# Patient Record
Sex: Female | Born: 1975 | Race: White | Hispanic: No | Marital: Married | Smoking: Never smoker
Health system: Southern US, Community
[De-identification: ages and names within clinical notes are randomized; demographics above are authoritative.]

---

## 1998-07-14 ENCOUNTER — Other Ambulatory Visit: Admission: RE | Admit: 1998-07-14 | Discharge: 1998-07-14 | Payer: Self-pay | Admitting: Obstetrics and Gynecology

## 2000-02-05 ENCOUNTER — Encounter: Payer: Self-pay | Admitting: Obstetrics and Gynecology

## 2000-02-05 ENCOUNTER — Ambulatory Visit (HOSPITAL_COMMUNITY): Admission: RE | Admit: 2000-02-05 | Discharge: 2000-02-05 | Payer: Self-pay | Admitting: Obstetrics and Gynecology

## 2000-12-07 ENCOUNTER — Inpatient Hospital Stay (HOSPITAL_COMMUNITY): Admission: AD | Admit: 2000-12-07 | Discharge: 2000-12-10 | Payer: Self-pay | Admitting: Obstetrics and Gynecology

## 2001-01-08 ENCOUNTER — Other Ambulatory Visit: Admission: RE | Admit: 2001-01-08 | Discharge: 2001-01-08 | Payer: Self-pay | Admitting: Obstetrics and Gynecology

## 2002-08-11 ENCOUNTER — Emergency Department (HOSPITAL_COMMUNITY): Admission: EM | Admit: 2002-08-11 | Discharge: 2002-08-11 | Payer: Self-pay | Admitting: Emergency Medicine

## 2003-01-04 ENCOUNTER — Other Ambulatory Visit: Admission: RE | Admit: 2003-01-04 | Discharge: 2003-01-04 | Payer: Self-pay | Admitting: Obstetrics and Gynecology

## 2004-01-24 ENCOUNTER — Other Ambulatory Visit: Admission: RE | Admit: 2004-01-24 | Discharge: 2004-01-24 | Payer: Self-pay | Admitting: Obstetrics and Gynecology

## 2004-08-22 ENCOUNTER — Inpatient Hospital Stay (HOSPITAL_COMMUNITY): Admission: AD | Admit: 2004-08-22 | Discharge: 2004-08-25 | Payer: Self-pay | Admitting: Obstetrics and Gynecology

## 2004-10-26 ENCOUNTER — Other Ambulatory Visit: Admission: RE | Admit: 2004-10-26 | Discharge: 2004-10-26 | Payer: Self-pay | Admitting: Obstetrics and Gynecology

## 2004-11-24 ENCOUNTER — Encounter: Admission: RE | Admit: 2004-11-24 | Discharge: 2004-12-24 | Payer: Self-pay | Admitting: Obstetrics and Gynecology

## 2004-12-25 ENCOUNTER — Encounter: Admission: RE | Admit: 2004-12-25 | Discharge: 2005-01-24 | Payer: Self-pay | Admitting: Obstetrics and Gynecology

## 2005-11-19 ENCOUNTER — Other Ambulatory Visit: Admission: RE | Admit: 2005-11-19 | Discharge: 2005-11-19 | Payer: Self-pay | Admitting: Obstetrics and Gynecology

## 2008-12-24 HISTORY — PX: REDUCTION MAMMAPLASTY: SUR839

## 2009-08-30 ENCOUNTER — Ambulatory Visit (HOSPITAL_BASED_OUTPATIENT_CLINIC_OR_DEPARTMENT_OTHER): Admission: RE | Admit: 2009-08-30 | Discharge: 2009-08-30 | Payer: Self-pay | Admitting: Plastic Surgery

## 2009-08-30 ENCOUNTER — Encounter (INDEPENDENT_AMBULATORY_CARE_PROVIDER_SITE_OTHER): Payer: Self-pay | Admitting: Plastic Surgery

## 2011-03-30 LAB — BASIC METABOLIC PANEL
BUN: 14 mg/dL (ref 6–23)
CO2: 25 mEq/L (ref 19–32)
Calcium: 9.2 mg/dL (ref 8.4–10.5)
Chloride: 106 mEq/L (ref 96–112)
Creatinine, Ser: 0.77 mg/dL (ref 0.4–1.2)
GFR calc Af Amer: >60 mL/min (ref >60)
GFR calc non Af Amer: >60 mL/min (ref >60)
Glucose, Bld: 95 mg/dL (ref 70–99)
Potassium: 3.6 mEq/L (ref 3.5–5.1)
Sodium: 139 mEq/L (ref 135–145)

## 2011-03-30 LAB — POCT HEMOGLOBIN-HEMACUE: Hemoglobin: 15.6 g/dL — ABNORMAL HIGH (ref 12.0–15.0)

## 2011-10-26 ENCOUNTER — Emergency Department (HOSPITAL_COMMUNITY)
Admission: EM | Admit: 2011-10-26 | Discharge: 2011-10-26 | Disposition: A | Discharge status: Home / Self Care | Payer: No Typology Code available for payment source | Attending: Emergency Medicine | Admitting: Emergency Medicine

## 2011-10-26 DIAGNOSIS — S0003XA Contusion of scalp, initial encounter: Secondary | ICD-10-CM | POA: Insufficient documentation

## 2011-10-26 DIAGNOSIS — S1093XA Contusion of unspecified part of neck, initial encounter: Secondary | ICD-10-CM | POA: Insufficient documentation

## 2011-10-26 DIAGNOSIS — R51 Headache: Secondary | ICD-10-CM | POA: Insufficient documentation

## 2013-01-06 ENCOUNTER — Other Ambulatory Visit: Payer: Self-pay | Admitting: Obstetrics and Gynecology

## 2014-04-13 ENCOUNTER — Other Ambulatory Visit: Payer: Self-pay | Admitting: Obstetrics and Gynecology

## 2015-03-30 ENCOUNTER — Other Ambulatory Visit: Payer: Self-pay | Admitting: Obstetrics and Gynecology

## 2015-03-31 LAB — CYTOLOGY - PAP

## 2018-06-12 ENCOUNTER — Encounter: Payer: Self-pay | Admitting: Gastroenterology

## 2018-07-04 ENCOUNTER — Other Ambulatory Visit: Payer: Self-pay | Admitting: Obstetrics and Gynecology

## 2018-07-04 DIAGNOSIS — N6002 Solitary cyst of left breast: Secondary | ICD-10-CM

## 2018-07-08 ENCOUNTER — Ambulatory Visit
Admission: RE | Admit: 2018-07-08 | Discharge: 2018-07-08 | Disposition: A | Discharge status: Home / Self Care | Payer: BLUE CROSS/BLUE SHIELD | Source: Ambulatory Visit | Attending: Obstetrics and Gynecology | Admitting: Obstetrics and Gynecology

## 2018-07-08 DIAGNOSIS — N6002 Solitary cyst of left breast: Secondary | ICD-10-CM

## 2018-07-24 ENCOUNTER — Other Ambulatory Visit: Payer: Self-pay | Admitting: Obstetrics and Gynecology

## 2018-07-24 DIAGNOSIS — C50919 Malignant neoplasm of unspecified site of unspecified female breast: Secondary | ICD-10-CM

## 2018-07-24 DIAGNOSIS — Z803 Family history of malignant neoplasm of breast: Secondary | ICD-10-CM

## 2018-07-24 DIAGNOSIS — Z1509 Genetic susceptibility to other malignant neoplasm: Secondary | ICD-10-CM

## 2018-07-24 DIAGNOSIS — Z1589 Genetic susceptibility to other disease: Secondary | ICD-10-CM

## 2018-07-24 DIAGNOSIS — Z1502 Genetic susceptibility to malignant neoplasm of ovary: Secondary | ICD-10-CM

## 2018-08-05 ENCOUNTER — Ambulatory Visit
Admission: RE | Admit: 2018-08-05 | Discharge: 2018-08-05 | Disposition: A | Discharge status: Home / Self Care | Payer: BLUE CROSS/BLUE SHIELD | Source: Ambulatory Visit | Attending: Obstetrics and Gynecology | Admitting: Obstetrics and Gynecology

## 2018-08-05 DIAGNOSIS — Z1502 Genetic susceptibility to malignant neoplasm of ovary: Secondary | ICD-10-CM

## 2018-08-05 DIAGNOSIS — Z1589 Genetic susceptibility to other disease: Secondary | ICD-10-CM

## 2018-08-05 DIAGNOSIS — Z1509 Genetic susceptibility to other malignant neoplasm: Secondary | ICD-10-CM

## 2018-08-05 DIAGNOSIS — C50919 Malignant neoplasm of unspecified site of unspecified female breast: Secondary | ICD-10-CM

## 2018-08-05 DIAGNOSIS — Z803 Family history of malignant neoplasm of breast: Secondary | ICD-10-CM

## 2018-08-05 MED ORDER — GADOBENATE DIMEGLUMINE 529 MG/ML IV SOLN
20.0000 mL | Freq: Once | INTRAVENOUS | Status: AC | PRN
  Administered 2018-08-05: 20 mL via INTRAVENOUS

## 2018-08-22 ENCOUNTER — Ambulatory Visit: Payer: Self-pay | Admitting: Gastroenterology

## 2018-09-17 ENCOUNTER — Other Ambulatory Visit: Payer: Self-pay | Admitting: General Surgery

## 2019-01-14 ENCOUNTER — Other Ambulatory Visit (HOSPITAL_COMMUNITY): Payer: BLUE CROSS/BLUE SHIELD

## 2019-01-20 ENCOUNTER — Ambulatory Visit: Admit: 2019-01-20 | Payer: BLUE CROSS/BLUE SHIELD | Admitting: General Surgery

## 2019-01-20 SURGERY — MASTECTOMY, NIPPLE SPARING
Anesthesia: General | Site: Breast | Laterality: Bilateral

## 2019-05-05 IMAGING — MG DIGITAL DIAGNOSTIC BILATERAL MAMMOGRAM WITH TOMO AND CAD
8 series · 8 of 24 positions shown · non-contrast
Comparison: Previous exam(s).

CLINICAL DATA: Patient presents for focal dimpling and palpable
abnormality within the upper inner left breast.

EXAM:
DIGITAL DIAGNOSTIC BILATERAL MAMMOGRAM WITH CAD AND TOMO
ULTRASOUND LEFT BREAST

[L CC synth-2D]
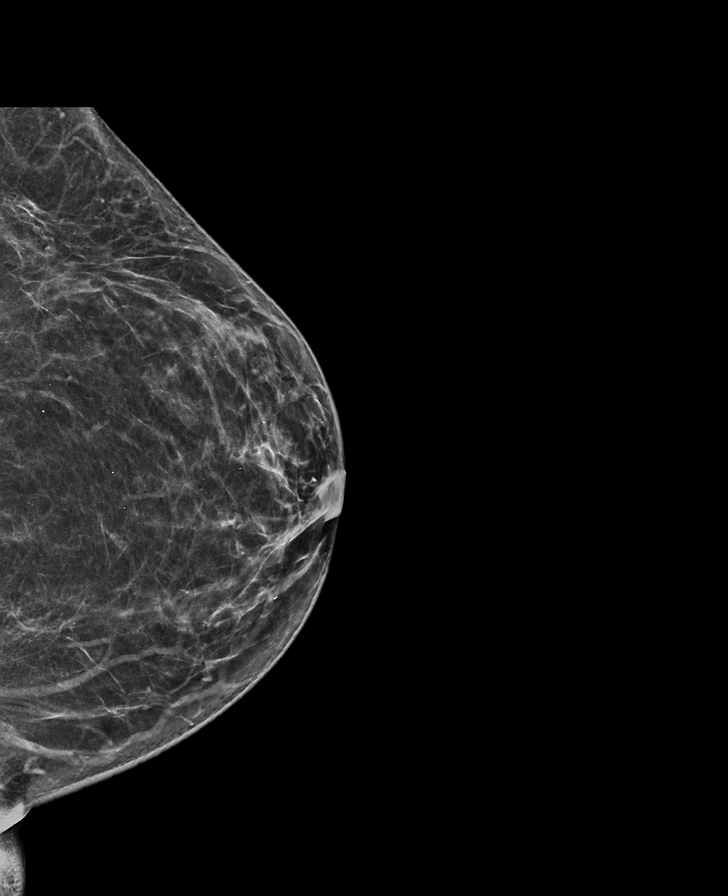

[R CC synth-2D]
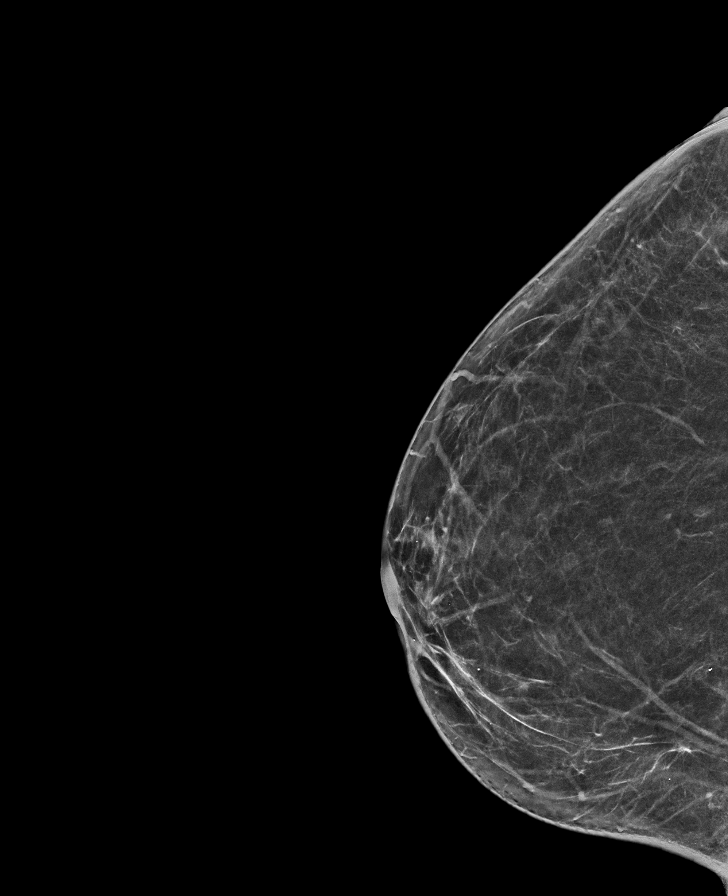

[L MLO synth-2D]
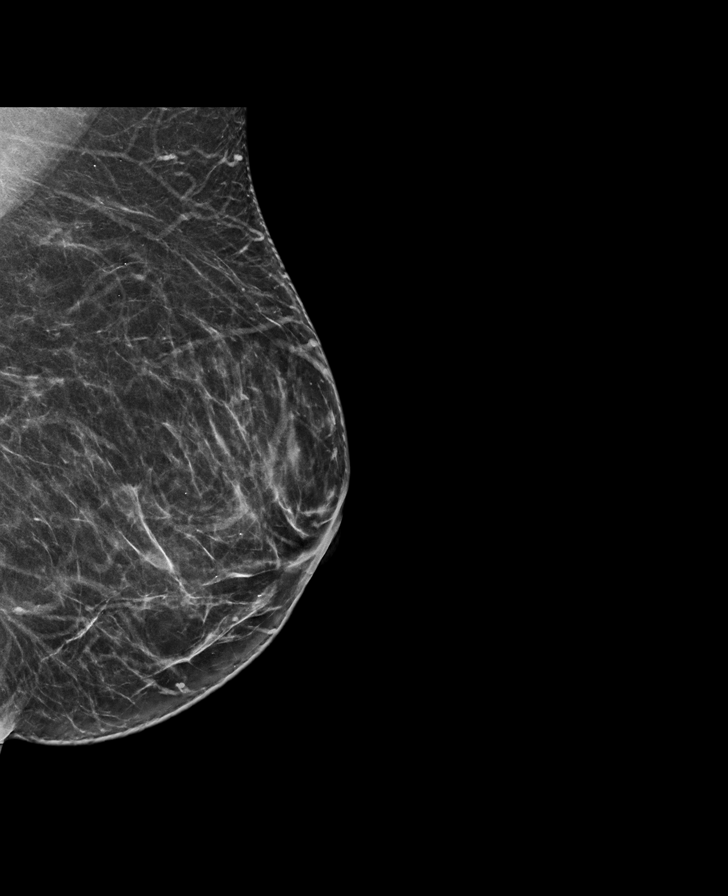

[R MLO synth-2D]
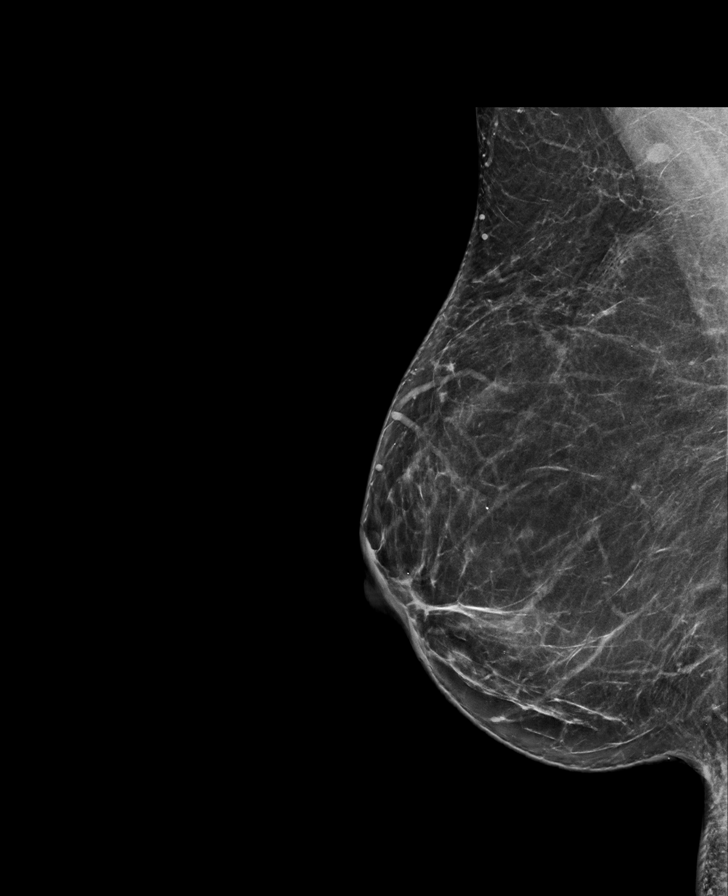

[R CC tomo · tomo slice 33/66.0]
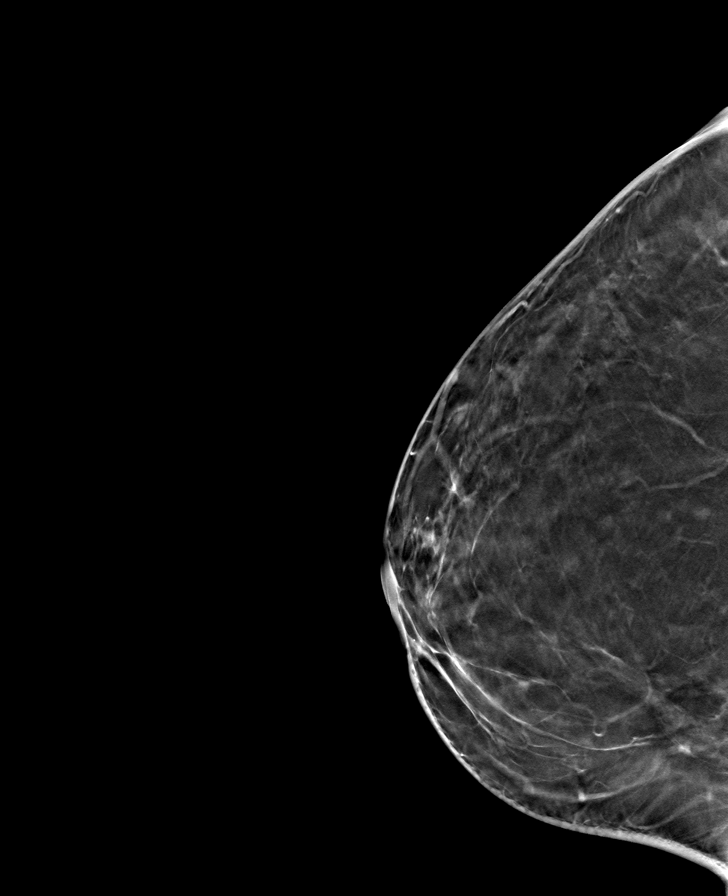

[L CC tomo · tomo slice 35/68.0]
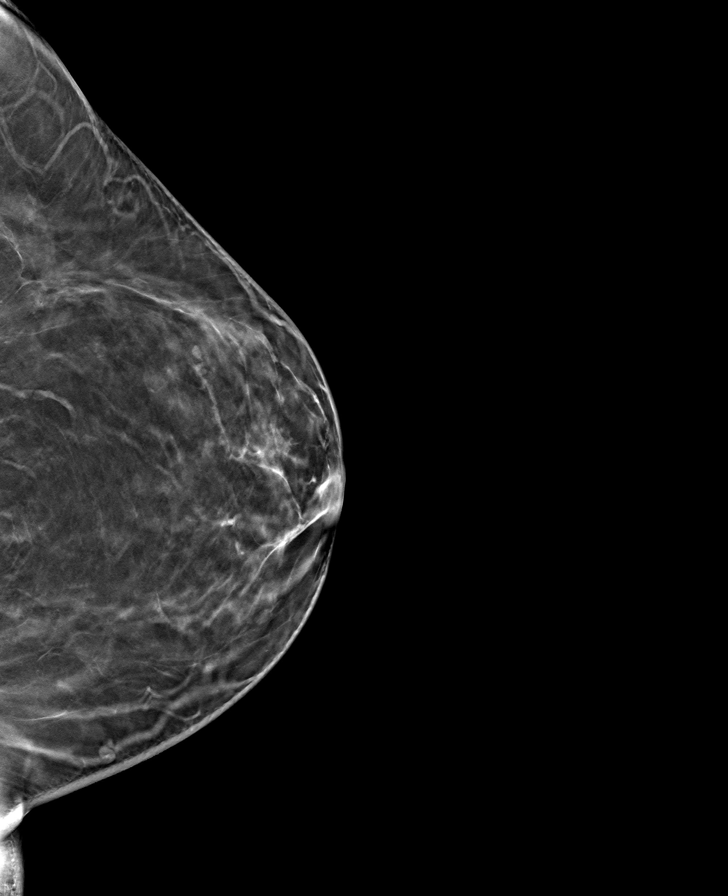

[R MLO tomo · tomo slice 39/76.0]
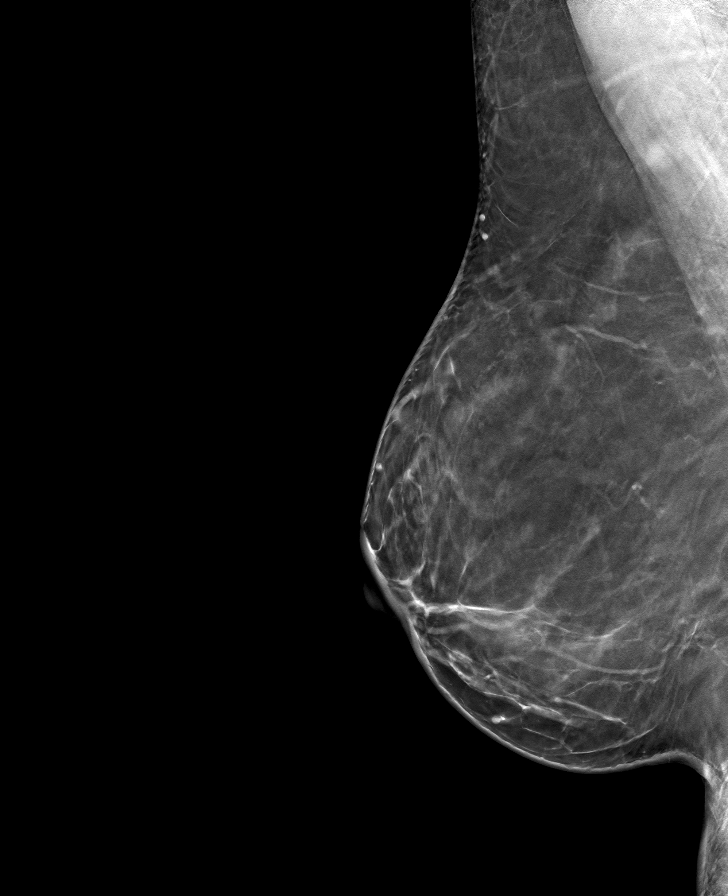

[L MLO tomo · tomo slice 35/69.0]
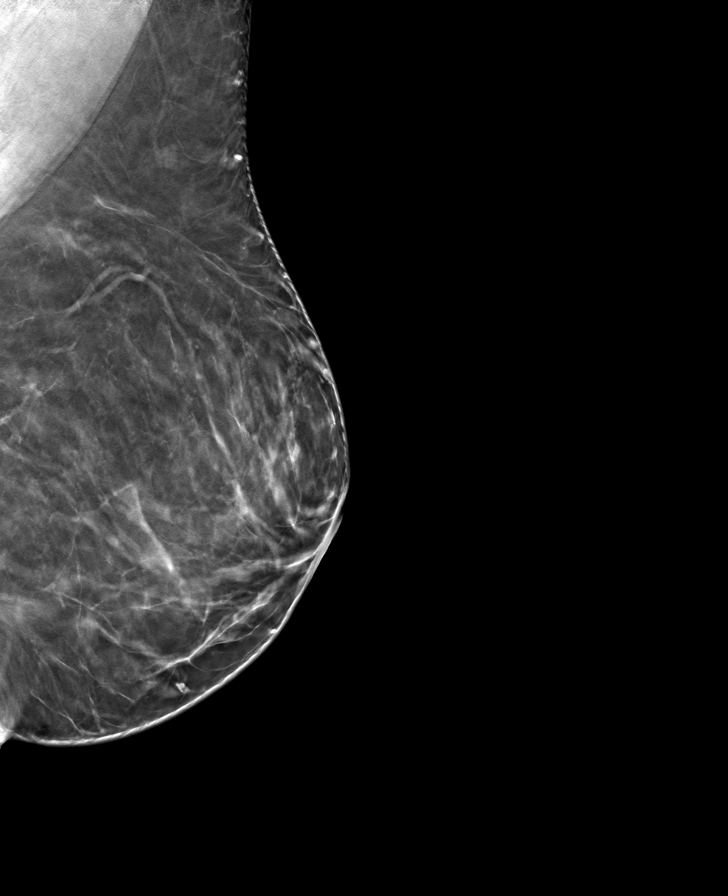

[8 of 24 positions shown; findings below may reference images not displayed]

ACR Breast Density Category b: There are scattered areas of
fibroglandular density.
FINDINGS: No concerning masses, calcifications or distortion identified within
either breast.

Mammographic images were processed with CAD.

On physical exam, I palpate no discrete mass within the upper inner
left breast.

Targeted ultrasound is performed, showing normal tissue without
suspicious mass left breast 10 o'clock position 5 cm from the
nipple.
IMPRESSION: No mammographic evidence for malignancy.

No suspicious abnormality at the site of palpable concern and focal
dimpling within the upper inner left breast.

RECOMMENDATION:
Continued clinical evaluation for reported left breast palpable
abnormality and focal dimpling.

Consider bilateral breast MRI for patient reported recent high risk
genetic abnormality.

Screening mammogram in one year.(Code:[id number]-CGX)

I have discussed the findings and recommendations with the patient.
Results were also provided in writing at the conclusion of the
visit. If applicable, a reminder letter will be sent to the patient
regarding the next appointment.

BI-RADS CATEGORY  2: Benign.

## 2019-06-15 ENCOUNTER — Encounter: Payer: Self-pay | Admitting: Gastroenterology

## 2019-06-15 ENCOUNTER — Ambulatory Visit (INDEPENDENT_AMBULATORY_CARE_PROVIDER_SITE_OTHER): Payer: BC Managed Care – PPO | Admitting: Gastroenterology

## 2019-06-15 VITALS — Ht 68.0 in | Wt 220.0 lb

## 2019-06-15 DIAGNOSIS — Z1509 Genetic susceptibility to other malignant neoplasm: Secondary | ICD-10-CM | POA: Diagnosis not present

## 2019-06-15 DIAGNOSIS — Z1589 Genetic susceptibility to other disease: Secondary | ICD-10-CM

## 2019-06-15 DIAGNOSIS — Z1501 Genetic susceptibility to malignant neoplasm of breast: Secondary | ICD-10-CM | POA: Diagnosis not present

## 2019-06-15 NOTE — Progress Notes (Signed)
This service was provided via virtual visit.  Both audio and visual were used.    The patient was located at home.  I was located in my office.  The patient did consent to this virtual visit and is aware of possible charges through their insurance for this visit.  The patient is a new patient.  My certified medical assistant, Grace Bushy, contributed to this visit by contacting the patient by phone 1 or 2 business days prior to the appointment and also followed up on the recommendations I made after the visit.  Time spent on virtual visit: 23 minutes   HPI: This is a very pleasant 43 year old woman who was referred by Dr. Helane Rima.  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  Over time she came to terms with this and March 2020 underwent a bilateral preventative mastectomy.  She understands that this genetic mutation puts her at risk for pancreatic cancer and so she was set up to meet me to discuss that.  She does not have a family history of pancreatic cancer that she is aware of  Chief complaint is PALB2 mutation    ROS: complete GI ROS as described in HPI, all other review negative.  Constitutional:  No unintentional weight loss   No past medical history on file.    Current Outpatient Medications  Medication Sig Dispense Refill  . ALPRAZolam (XANAX) 0.5 MG tablet Take 0.5 mg by mouth as needed for anxiety.    Marland Kitchen escitalopram (LEXAPRO) 20 MG tablet Take 20 mg by mouth daily.     No current facility-administered medications for this visit.     Allergies as of 06/15/2019 - Review Complete 06/15/2019  Allergen Reaction Noted  . Penicillins Swelling 06/15/2019    No family history on file.  Social History   Socioeconomic History  . Marital status: Married    Spouse name: Not on file  . Number of children: Not on file  . Years of education: Not on file  . Highest education level: Not on file  Occupational History  . Not on file  Social  Needs  . Financial resource strain: Not on file  . Food insecurity    Worry: Not on file    Inability: Not on file  . Transportation needs    Medical: Not on file    Non-medical: Not on file  Tobacco Use  . Smoking status: Never Smoker  . Smokeless tobacco: Never Used  Substance and Sexual Activity  . Alcohol use: Yes  . Drug use: Never  . Sexual activity: Not on file  Lifestyle  . Physical activity    Days per week: Not on file    Minutes per session: Not on file  . Stress: Not on file  Relationships  . Social Herbalist on phone: Not on file    Gets together: Not on file    Attends religious service: Not on file    Active member of club or organization: Not on file    Attends meetings of clubs or organizations: Not on file    Relationship status: Not on file  . Intimate partner violence    Fear of current or ex partner: Not on file    Emotionally abused: Not on file    Physically abused: Not on file    Forced sexual activity: Not on file  Other Topics Concern  . Not on file  Social History Narrative  .  Not on file     Physical Exam: Unable to perform because this was a "telemed visit" due to current Covid-19 pandemic  Assessment and plan: 43 y.o. female with PALB2 mutation  We had a very nice discussion.  I explained to her that although she is at slightly increased risk for pancreatic cancer, based on current 2020 NCCN guidelines she does not warrant pancreatic cancer screening since she does not have a family history of pancreatic cancer in either first or second-degree relatives.  She plans to reach out to her father's side of the family to make sure of that fact and will get back in touch with me if she finds out otherwise.  She is having a revision of her mastectomy later this month and I hope it all goes very well for her.  She is very nice woman and she knows to contact me if she has any further questions or concerns about this issue or any GI issue  that might come up.  Please see the "Patient Instructions" section for addition details about the plan.  Owens Loffler, MD [facility] 06/15/2019, 3:50 PM

## 2019-06-15 NOTE — Patient Instructions (Addendum)
She plans to reach out to her father's side of the family to quiz them about pancreatic cancer in the family.    She knows to contact me if she finds out that any second-degree relatives have had pancreatic cancer  Thank you for entrusting me with your care and choosing [facility].  Dr Ardis Hughs

## 2020-01-05 ENCOUNTER — Other Ambulatory Visit: Payer: Self-pay | Admitting: Obstetrics and Gynecology

## 2020-01-05 DIAGNOSIS — N6320 Unspecified lump in the left breast, unspecified quadrant: Secondary | ICD-10-CM

## 2020-01-19 ENCOUNTER — Other Ambulatory Visit: Payer: BC Managed Care – PPO

## 2024-01-20 ENCOUNTER — Ambulatory Visit (INDEPENDENT_AMBULATORY_CARE_PROVIDER_SITE_OTHER): Payer: BC Managed Care – PPO

## 2024-01-20 ENCOUNTER — Ambulatory Visit: Payer: BC Managed Care – PPO | Admitting: Podiatry

## 2024-01-20 ENCOUNTER — Encounter: Payer: Self-pay | Admitting: Podiatry

## 2024-01-20 DIAGNOSIS — M778 Other enthesopathies, not elsewhere classified: Secondary | ICD-10-CM | POA: Diagnosis not present

## 2024-01-20 DIAGNOSIS — M722 Plantar fascial fibromatosis: Secondary | ICD-10-CM

## 2024-01-20 MED ORDER — BETAMETHASONE SOD PHOS & ACET 6 (3-3) MG/ML IJ SUSP
3.0000 mg | Freq: Once | INTRAMUSCULAR | Status: AC
  Administered 2024-01-20: 3 mg via INTRA_ARTICULAR

## 2024-01-20 NOTE — Progress Notes (Signed)
   Chief Complaint  Patient presents with   Foot Pain    Patient states "for a while she has always had heel pain, especially if she stands for long periods of time, the pass 4 months her right heel has got worst." Patient is taking medication for pain . " The pain is in the back outside part of her heel."    Subjective: 48 y.o. female presenting today as a new patient for evaluation of pain and tenderness associated to the right plantar heel ongoing for a few years now intermittently.  Patient states that she was diagnosed a few years ago by an orthopedist with heel spurs.  She has had intermittent pain and tenderness ever since.  She currently takes diclofenac 75 mg BID for knee pain.  She also has OTC prefabricated arch supports which seem to help alleviate a lot of her pain.   History reviewed. No pertinent past medical history.   Objective: Physical Exam General: The patient is alert and oriented x3 in no acute distress.  Dermatology: Skin is warm, dry and supple bilateral lower extremities. Negative for open lesions or macerations bilateral.   Vascular: Dorsalis Pedis and Posterior Tibial pulses palpable bilateral.  Capillary fill time is immediate to all digits.  Neurological: Grossly intact via light touch  Musculoskeletal: Tenderness to palpation to the plantar aspect of the right heel along the plantar fascia. All other joints range of motion within normal limits bilateral. Strength 5/5 in all groups bilateral.   Radiographic exam RT foot 01/20/2024: Normal osseous mineralization.  Posterior and plantar heel spur noted.  Joint spaces preserved. No fracture/dislocation/boney destruction. No other soft tissue abnormalities or radiopaque foreign bodies.   Assessment: 1. Plantar fasciitis right 2.  Posterior and plantar heel spur right  Plan of Care:  -Patient evaluated. Xrays reviewed.   -Injection of 0.5 cc Celestone Soluspan injected into the plantar fascia right -Patient  declined a Medrol Dosepak. -Continue diclofenac 75 mg BID as prescribed from orthopedist -Continue OTC prefabricated arch supports -Patient admits to walking around the house barefoot.  Discontinue.  Recommend good supportive shoes and sneakers or slides that provide arch support even around the home -Patient also wears flats at work.  Advised against this.  Recommend shoes that provide plenty of arch support -Return to clinic 4 weeks  *Full-time at [employer].  Part-time at [employer]   Felecia Shelling, DPM [facility]  Dr. Felecia Shelling, DPM    [address] [place], [place] [address]                Office [phone]  Fax [phone]

## 2024-02-19 ENCOUNTER — Ambulatory Visit: Payer: BC Managed Care – PPO | Admitting: Podiatry
